# Patient Record
Sex: Female | Born: 1990 | Race: White | Hispanic: No | Marital: Single | Smoking: Never smoker
Health system: Southern US, Community
[De-identification: ages and names within clinical notes are randomized; demographics above are authoritative.]

## PROBLEM LIST (undated history)

## (undated) DIAGNOSIS — Z789 Other specified health status: Secondary | ICD-10-CM

## (undated) HISTORY — PX: NO PAST SURGERIES: SHX2092

---

## 2014-11-15 ENCOUNTER — Encounter (HOSPITAL_COMMUNITY): Payer: Self-pay | Admitting: Emergency Medicine

## 2014-11-15 ENCOUNTER — Observation Stay (HOSPITAL_COMMUNITY)
Admission: EM | Admit: 2014-11-15 | Discharge: 2014-11-16 | Disposition: A | Discharge status: Home / Self Care | Payer: BLUE CROSS/BLUE SHIELD | Attending: General Surgery | Admitting: General Surgery

## 2014-11-15 ENCOUNTER — Emergency Department (HOSPITAL_COMMUNITY): Payer: BLUE CROSS/BLUE SHIELD

## 2014-11-15 ENCOUNTER — Encounter (HOSPITAL_COMMUNITY)
Admission: EM | Disposition: A | Discharge status: Home / Self Care | Payer: Self-pay | Source: Home / Self Care | Attending: Emergency Medicine

## 2014-11-15 DIAGNOSIS — Z880 Allergy status to penicillin: Secondary | ICD-10-CM | POA: Diagnosis not present

## 2014-11-15 DIAGNOSIS — K353 Acute appendicitis with localized peritonitis, without perforation or gangrene: Secondary | ICD-10-CM

## 2014-11-15 DIAGNOSIS — R1031 Right lower quadrant pain: Secondary | ICD-10-CM | POA: Diagnosis present

## 2014-11-15 DIAGNOSIS — K3580 Unspecified acute appendicitis: Secondary | ICD-10-CM | POA: Diagnosis present

## 2014-11-15 HISTORY — PX: LAPAROSCOPIC APPENDECTOMY: SHX408

## 2014-11-15 HISTORY — DX: Other specified health status: Z78.9

## 2014-11-15 LAB — COMPREHENSIVE METABOLIC PANEL
ALT: 13 U/L — AB (ref 14–54)
AST: 22 U/L (ref 15–41)
Albumin: 4.5 g/dL (ref 3.5–5.0)
Alkaline Phosphatase: 58 U/L (ref 38–126)
Anion gap: 8 (ref 5–15)
BILIRUBIN TOTAL: 1 mg/dL (ref 0.3–1.2)
BUN: 7 mg/dL (ref 6–20)
CO2: 25 mmol/L (ref 22–32)
CREATININE: 0.6 mg/dL (ref 0.44–1.00)
Calcium: 9.3 mg/dL (ref 8.9–10.3)
Chloride: 105 mmol/L (ref 101–111)
GFR calc Af Amer: >60 mL/min (ref >60)
GFR calc non Af Amer: >60 mL/min (ref >60)
GLUCOSE: 84 mg/dL (ref 65–99)
Potassium: 4.2 mmol/L (ref 3.5–5.1)
Sodium: 138 mmol/L (ref 135–145)
TOTAL PROTEIN: 7.5 g/dL (ref 6.5–8.1)

## 2014-11-15 LAB — WET PREP, GENITAL
Clue Cells Wet Prep HPF POC: NONE SEEN
Trich, Wet Prep: NONE SEEN
Yeast Wet Prep HPF POC: NONE SEEN

## 2014-11-15 LAB — CBC
HCT: 40.4 % (ref 36.0–46.0)
Hemoglobin: 13.7 g/dL (ref 12.0–15.0)
MCH: 30.6 pg (ref 26.0–34.0)
MCHC: 33.9 g/dL (ref 30.0–36.0)
MCV: 90.2 fL (ref 78.0–100.0)
PLATELETS: 232 10*3/uL (ref 150–400)
RBC: 4.48 MIL/uL (ref 3.87–5.11)
RDW: 12.6 % (ref 11.5–15.5)
WBC: 13.5 10*3/uL — AB (ref 4.0–10.5)

## 2014-11-15 LAB — URINALYSIS, ROUTINE W REFLEX MICROSCOPIC
BILIRUBIN URINE: NEGATIVE
Glucose, UA: NEGATIVE mg/dL
HGB URINE DIPSTICK: NEGATIVE
KETONES UR: 15 mg/dL — AB
NITRITE: NEGATIVE
PH: 6.5 (ref 5.0–8.0)
Protein, ur: NEGATIVE mg/dL
Specific Gravity, Urine: 1.003 — ABNORMAL LOW (ref 1.005–1.030)
UROBILINOGEN UA: 0.2 mg/dL (ref 0.0–1.0)

## 2014-11-15 LAB — URINE MICROSCOPIC-ADD ON

## 2014-11-15 LAB — I-STAT BETA HCG BLOOD, ED (MC, WL, AP ONLY): I-stat hCG, quantitative: <5 m[IU]/mL (ref <5)

## 2014-11-15 LAB — LIPASE, BLOOD: Lipase: 20 U/L — ABNORMAL LOW (ref 22–51)

## 2014-11-15 SURGERY — APPENDECTOMY, LAPAROSCOPIC
Anesthesia: General

## 2014-11-15 MED ORDER — ONDANSETRON HCL 4 MG/2ML IJ SOLN
4.0000 mg | Freq: Once | INTRAMUSCULAR | Status: AC
  Administered 2014-11-15: 4 mg via INTRAVENOUS
  Filled 2014-11-15: qty 2

## 2014-11-15 MED ORDER — METRONIDAZOLE IN NACL 5-0.79 MG/ML-% IV SOLN
500.0000 mg | Freq: Once | INTRAVENOUS | Status: AC
  Administered 2014-11-15: 500 mg via INTRAVENOUS

## 2014-11-15 MED ORDER — MORPHINE SULFATE (PF) 4 MG/ML IV SOLN
4.0000 mg | Freq: Once | INTRAVENOUS | Status: AC
  Administered 2014-11-15: 4 mg via INTRAVENOUS
  Filled 2014-11-15: qty 1

## 2014-11-15 MED ORDER — IOHEXOL 300 MG/ML  SOLN
100.0000 mL | Freq: Once | INTRAMUSCULAR | Status: AC | PRN
  Administered 2014-11-15: 100 mL via INTRAVENOUS

## 2014-11-15 MED ORDER — ROCURONIUM BROMIDE 100 MG/10ML IV SOLN
INTRAVENOUS | Status: AC
  Filled 2014-11-15: qty 1

## 2014-11-15 MED ORDER — DEXAMETHASONE SODIUM PHOSPHATE 10 MG/ML IJ SOLN
INTRAMUSCULAR | Status: AC
  Filled 2014-11-15: qty 1

## 2014-11-15 MED ORDER — CIPROFLOXACIN IN D5W 400 MG/200ML IV SOLN
400.0000 mg | Freq: Once | INTRAVENOUS | Status: AC
  Administered 2014-11-16: 400 mg via INTRAVENOUS

## 2014-11-15 MED ORDER — LIDOCAINE HCL (CARDIAC) 20 MG/ML IV SOLN
INTRAVENOUS | Status: AC
  Filled 2014-11-15: qty 5

## 2014-11-15 MED ORDER — IOHEXOL 300 MG/ML  SOLN
25.0000 mL | Freq: Once | INTRAMUSCULAR | Status: AC | PRN
  Administered 2014-11-15: 25 mL via ORAL

## 2014-11-15 MED ORDER — MIDAZOLAM HCL 2 MG/2ML IJ SOLN
INTRAMUSCULAR | Status: AC
Start: 2014-11-15 — End: 2014-11-15
  Filled 2014-11-15: qty 4

## 2014-11-15 MED ORDER — PROPOFOL 10 MG/ML IV BOLUS
INTRAVENOUS | Status: AC
  Filled 2014-11-15: qty 20

## 2014-11-15 MED ORDER — FENTANYL CITRATE (PF) 250 MCG/5ML IJ SOLN
INTRAMUSCULAR | Status: AC
  Filled 2014-11-15: qty 25

## 2014-11-15 MED ORDER — INFLUENZA VAC SPLIT QUAD 0.5 ML IM SUSY
0.5000 mL | PREFILLED_SYRINGE | INTRAMUSCULAR | Status: DC
  Filled 2014-11-15: qty 0.5

## 2014-11-15 MED ORDER — ONDANSETRON HCL 4 MG/2ML IJ SOLN
INTRAMUSCULAR | Status: AC
  Filled 2014-11-15: qty 2

## 2014-11-15 MED ORDER — SODIUM CHLORIDE 0.9 % IV BOLUS (SEPSIS)
1000.0000 mL | Freq: Once | INTRAVENOUS | Status: AC
  Administered 2014-11-15: 1000 mL via INTRAVENOUS

## 2014-11-15 MED ORDER — METRONIDAZOLE IN NACL 5-0.79 MG/ML-% IV SOLN
INTRAVENOUS | Status: AC
  Filled 2014-11-15: qty 100

## 2014-11-15 SURGICAL SUPPLY — 34 items
APPLIER CLIP 5 13 M/L LIGAMAX5 (MISCELLANEOUS)
APPLIER CLIP ROT 10 11.4 M/L (STAPLE)
CABLE HIGH FREQUENCY MONO STRZ (ELECTRODE) ×3 IMPLANT
CHLORAPREP W/TINT 26ML (MISCELLANEOUS) ×3 IMPLANT
CLIP APPLIE 5 13 M/L LIGAMAX5 (MISCELLANEOUS) IMPLANT
CLIP APPLIE ROT 10 11.4 M/L (STAPLE) IMPLANT
COVER SURGICAL LIGHT HANDLE (MISCELLANEOUS) ×3 IMPLANT
DECANTER SPIKE VIAL GLASS SM (MISCELLANEOUS) ×3 IMPLANT
DRAPE LAPAROSCOPIC ABDOMINAL (DRAPES) ×3 IMPLANT
ELECT REM PT RETURN 9FT ADLT (ELECTROSURGICAL) ×3
ELECTRODE REM PT RTRN 9FT ADLT (ELECTROSURGICAL) ×1 IMPLANT
GLOVE BIO SURGEON STRL SZ 6.5 (GLOVE) ×2 IMPLANT
GLOVE BIO SURGEONS STRL SZ 6.5 (GLOVE) ×1
GLOVE BIOGEL PI IND STRL 7.0 (GLOVE) ×1 IMPLANT
GLOVE BIOGEL PI INDICATOR 7.0 (GLOVE) ×2
GOWN STRL REUS W/TWL 2XL LVL3 (GOWN DISPOSABLE) ×3 IMPLANT
GOWN STRL REUS W/TWL XL LVL3 (GOWN DISPOSABLE) ×3 IMPLANT
HANDLE STAPLE EGIA 4 XL (STAPLE) IMPLANT
KIT BASIN OR (CUSTOM PROCEDURE TRAY) ×3 IMPLANT
LIQUID BAND (GAUZE/BANDAGES/DRESSINGS) ×3 IMPLANT
PEN SKIN MARKING BROAD (MISCELLANEOUS) ×3 IMPLANT
POUCH SPECIMEN RETRIEVAL 10MM (ENDOMECHANICALS) ×3 IMPLANT
RELOAD EGIA 45 MED/THCK PURPLE (STAPLE) ×3 IMPLANT
RELOAD EGIA 45 TAN VASC (STAPLE) ×3 IMPLANT
SCISSORS LAP 5X35 DISP (ENDOMECHANICALS) ×3 IMPLANT
SET IRRIG TUBING LAPAROSCOPIC (IRRIGATION / IRRIGATOR) ×3 IMPLANT
SLEEVE XCEL OPT CAN 5 100 (ENDOMECHANICALS) IMPLANT
SUT VIC AB 4-0 PS2 27 (SUTURE) ×3 IMPLANT
TOWEL OR 17X26 10 PK STRL BLUE (TOWEL DISPOSABLE) ×3 IMPLANT
TRAY FOLEY W/METER SILVER 14FR (SET/KITS/TRAYS/PACK) ×3 IMPLANT
TRAY FOLEY W/METER SILVER 16FR (SET/KITS/TRAYS/PACK) IMPLANT
TRAY LAPAROSCOPIC (CUSTOM PROCEDURE TRAY) ×3 IMPLANT
TROCAR BLADELESS OPT 5 100 (ENDOMECHANICALS) IMPLANT
TROCAR XCEL BLUNT TIP 100MML (ENDOMECHANICALS) ×3 IMPLANT

## 2014-11-15 NOTE — ED Provider Notes (Signed)
CSN: [id number]     Arrival date & time 11/15/14  1542 History   First MD Initiated Contact with Patient 11/15/14 1640     Chief Complaint  Patient presents with  . Abdominal Pain     (Consider location/radiation/quality/duration/timing/severity/associated sxs/prior Treatment) HPI   24 year old female presents for evaluation of abdominal pain. Patient states since yesterday she has developed worse gradual onset worsening crampy abdominal pain. Pain initially started to the left abdomen and now radiates to her right lower quadrant. Pain is 6 out of 10 with resting and 7 out of 10 when she pressed on her abdomen. She endorse nausea without vomiting or diarrhea. She denies any specific treatment tried. She was initially seen at [facility] health services today for this complaint and was recommended to come to ER for further evaluation. She denies having any fever, chills, chest pain, shortness of breath, productive cough, dysuria, hematuria, vaginal bleeding, vaginal discharge, or rash. She denies any strenuous activities. She reports decrease in appetite last meal was yesterday. She is sexually active with 2 partners within the past 6 months using protection on occasion. Her last menstrual period was 2 weeks ago. She has not had a prior history of STD or abdominal surgery. She is currently on birth control pills. Patient does have a primary care provider in her hometown however she is here for school.   History reviewed. No pertinent past medical history. History reviewed. No pertinent past surgical history. No family history on file. Social History  Substance Use Topics  . Smoking status: None  . Smokeless tobacco: None  . Alcohol Use: Yes     Comment: occasional   OB History    No data available     Review of Systems  All other systems reviewed and are negative.     Allergies  Amoxicillin and Penicillins  Home Medications   Prior to Admission medications   Medication Sig Start Date  End Date Taking? Authorizing Provider  albuterol (PROVENTIL HFA;VENTOLIN HFA) 108 (90 BASE) MCG/ACT inhaler Inhale 2 puffs into the lungs every 4 (four) hours as needed for wheezing or shortness of breath.   Yes Historical Provider, MD  fexofenadine (ALLEGRA) 180 MG tablet Take 180 mg by mouth daily as needed for allergies or rhinitis.   Yes Historical Provider, MD  mometasone (NASONEX) 50 MCG/ACT nasal spray Place 2 sprays into the nose daily as needed (allergies).   Yes Historical Provider, MD  montelukast (SINGULAIR) 10 MG tablet Take 10 mg by mouth at bedtime as needed (allergy season).   Yes Historical Provider, MD  mupirocin ointment (BACTROBAN) 2 % Place 1 application into the nose 2 (two) times daily as needed (as needed for infection on nose.).   Yes Historical Provider, MD  norgestimate-ethinyl estradiol (MONO-LINYAH) 0.25-35 MG-MCG tablet Take 1 tablet by mouth daily.   Yes Historical Provider, MD   BP 121/65 mmHg  Pulse 97  Temp(Src) 98.2 F (36.8 C) (Oral)  Resp 20  Ht 5' 5.25" (1.657 m)  Wt 140 lb 3 oz (63.589 kg)  BMI 23.16 kg/m2  SpO2 99%  LMP 11/01/2014 Physical Exam  Constitutional: She appears well-developed and well-nourished. No distress.  HENT:  Head: Atraumatic.  Eyes: Conjunctivae are normal.  Neck: Neck supple.  Cardiovascular: Normal rate and regular rhythm.   Pulmonary/Chest: Effort normal and breath sounds normal.  Abdominal: Soft. Bowel sounds are normal. She exhibits no distension. There is tenderness (Right lower quadrant tenderness with mild guarding but no rebound tenderness. Positive psoas/obturator  sign.  pain at McBurney's point.  Negative Murphy sign.).  Genitourinary:  Pelvic exam: RN in room as chaperone, external female genitalia normal with no signs of lesions or injuries. Speculum exam shows normal cervix with no obvious discharge. Bimanual exam with no adnexal tenderness, no cervical motion tenderness, uterus normal size and nontender, no masses  appreciated. The external cervical os is closed.   Neurological: She is alert.  Skin: No rash noted.  Psychiatric: She has a normal mood and affect.  Nursing note and vitals reviewed.   ED Course  Procedures (including critical care time)  4:58 PM Pt here with RLQ abd pain, leukocytosis, and anorexia concerning for appy.  Work up initiated. Pelvic examination unremarkable.    9:08 PM Abdominal and pelvis CT scan demonstrated evidence of acute appendicitis with dilated and inflamed appendix. No evidence of abscess or perforation at this time although there is moderate volume of free fluid in the right lower quadrant and pelvis which favor reactive and less likely perforation.   Appreciate consultation from [facility] Dr. Maisie Fus who will see pt in ER and will admit for further management.  Pt's last meal was yesterday.  Care discussed with Dr. Ethelda Chick.  Pt is made aware of plan and agrees with plan.    Labs Review Labs Reviewed  WET PREP, GENITAL - Abnormal; Notable for the following:    WBC, Wet Prep HPF POC FEW (*)    All other components within normal limits  LIPASE, BLOOD - Abnormal; Notable for the following:    Lipase 20 (*)    All other components within normal limits  COMPREHENSIVE METABOLIC PANEL - Abnormal; Notable for the following:    ALT 13 (*)    All other components within normal limits  CBC - Abnormal; Notable for the following:    WBC 13.5 (*)    All other components within normal limits  URINALYSIS, ROUTINE W REFLEX MICROSCOPIC (NOT AT [facility]) - Abnormal; Notable for the following:    Specific Gravity, Urine 1.003 (*)    Ketones, ur 15 (*)    Leukocytes, UA TRACE (*)    All other components within normal limits  URINE MICROSCOPIC-ADD ON - Abnormal; Notable for the following:    Squamous Epithelial / LPF FEW (*)    All other components within normal limits  RPR  HIV ANTIBODY (ROUTINE TESTING)  I-STAT BETA HCG BLOOD, ED ([facility], [facility], [facility] ONLY)  GC/CHLAMYDIA PROBE AMP  ([place]) NOT AT [facility]    Imaging Review Ct Abdomen Pelvis W Contrast  11/15/2014  CLINICAL DATA:  24 year old female with lower abdominal pain since yesterday, maximal in the right lower quadrant. Nausea. Initial encounter. EXAM: CT ABDOMEN AND PELVIS WITH CONTRAST TECHNIQUE: Multidetector CT imaging of the abdomen and pelvis was performed using the standard protocol following bolus administration of intravenous contrast. CONTRAST:  25mL OMNIPAQUE IOHEXOL 300 MG/ML SOLN, OMNIPAQUE IOHEXOL 300 MG/ML SOLN COMPARISON:  None. FINDINGS: Negative lung bases.  No pericardial or pleural effusion. Lower lumbar disc bulging. No acute osseous abnormality identified. Moderate volume of pelvic free fluid. Negative uterus and adnexa. Negative rectum, gas distended. Diminutive urinary bladder. Oral contrast has reached the sigmoid colon which is redundant. Intermittent retained stool throughout the colon. In the right lower quadrant the appendix is abnormal. It is dilated and inflamed, measuring up to 18 mm in thickness. Hyper enhancement throughout the appendix. Small volume surrounding free fluid. No extraluminal gas identified. Some low-density stool suspected in the appendix. The terminal  ileum is unaffected. No dilated small bowel. Negative stomach and duodenum. No upper abdominal free fluid or free air. Liver, gallbladder, spleen, pancreas and adrenal glands are within normal limits. Portal venous system is patent. Major arterial structures are patent. Both kidneys are normal. No lymphadenopathy. IMPRESSION: Acute appendicitis with dilated and inflamed appendix. Small to moderate volume of free fluid in the right lower quadrant and pelvis, favor reactive without strong evidence of perforation at this time. Electronically Signed   By: Odessa FlemingH  Hall M.D.   On: 11/15/2014 20:52   I have personally reviewed and evaluated these images and lab results as part of my medical decision-making.   EKG  Interpretation None      MDM   Final diagnoses:  Acute appendicitis with localized peritonitis    BP 112/57 mmHg  Pulse 81  Temp(Src) 98.6 F (37 C) (Oral)  Resp 16  Ht 5' 5.25" (1.657 m)  Wt 140 lb 3 oz (63.589 kg)  BMI 23.16 kg/m2  SpO2 100%  LMP 11/01/2014  The patient's emergency department course was discussed with the admitting team. They have agreed to evaluate and admit the patient to their service for continued medical care.  This was discussed with the patient and they are in agreement with this plan. Further orders and medical decision making per admitting team.     Fayrene HelperBowie Vamsi Apfel, PA-C 11/15/14 2111  Doug SouSam Jacubowitz, MD 11/16/14 [id number]

## 2014-11-15 NOTE — ED Provider Notes (Signed)
Complains of lower abdominal pain onset yesterday morning which has since migrated to right lower quadrant. Pain is under control since treatment here. She denies other symptoms. On exam patient alert no distress abdomen nondistended. Tender right lower quadrant. No guarding rigidity or rebound  Doug SouSam Briceida Rasberry, MD 11/15/14 1953

## 2014-11-15 NOTE — Progress Notes (Signed)
Patient listed as not having a pcp or insurance.  EDCM spoke to patient at bedside.  Patient reports he has [employer] insurance without a pcp.  Patient is from [place] going to school in [place] at [employer].  Patient is aware there is a physician on campus.  EDCM instructed patient that she may call the phone number on the back of her insurance card or go to [employer] web site to help her find a pcp who is close to her and within network.  Patient thankful for information.  No further EDCM needs at this time.

## 2014-11-15 NOTE — ED Notes (Signed)
Patient transported to CT 

## 2014-11-15 NOTE — H&P (Signed)
Charish Schroepfer is an 24 y.o. female.   Chief Complaint: rlq pain HPI:        Complains of lower abdominal pain that started yesterday morning and has since migrated to right lower quadrant. She denies other symptoms except mild nausea.  She has never had this pain before.  Denies dysuria or hematuria.  She has never had any abdominal surgeries before.        History reviewed. No pertinent past medical history.  History reviewed. No pertinent past surgical history.  No family history on file. Social History:  reports that she drinks alcohol. Her tobacco and drug histories are not on file.  Allergies:  Allergies  Allergen Reactions  . Amoxicillin Hives    Has patient had a PCN reaction causing immediate rash, facial/tongue/throat swelling, SOB or lightheadedness with hypotension: No Has patient had a PCN reaction causing severe rash involving mucus membranes or skin necrosis: yes-hives  Has patient had a PCN reaction that required hospitalization No Has patient had a PCN reaction occurring within the last 10 years: Yes If all of the above answers are "NO", then may proceed with Cephalosporin use.   Marland Kitchen Penicillins Hives    Has patient had a PCN reaction causing immediate rash, facial/tongue/throat swelling, SOB or lightheadedness with hypotension: No Has patient had a PCN reaction causing severe rash involving mucus membranes or skin necrosis: Yes-hives  Has patient had a PCN reaction that required hospitalization No Has patient had a PCN reaction occurring within the last 10 years: Yes  If all of the above answers are "NO", then may proceed with Cephalosporin use.      (Not in a hospital admission)  Results for orders placed or performed during the hospital encounter of 11/15/14 (from the past 48 hour(s))  Urinalysis, Routine w reflex microscopic (not at [facility])     Status: Abnormal   Collection Time: 11/15/14  4:05 PM  Result Value Ref Range   Color, Urine YELLOW YELLOW   APPearance CLEAR CLEAR   Specific Gravity, Urine 1.003 (L) 1.005 - 1.030   pH 6.5 5.0 - 8.0   Glucose, UA NEGATIVE NEGATIVE mg/dL   Hgb urine dipstick NEGATIVE NEGATIVE   Bilirubin Urine NEGATIVE NEGATIVE   Ketones, ur 15 (A) NEGATIVE mg/dL   Protein, ur NEGATIVE NEGATIVE mg/dL   Urobilinogen, UA 0.2 0.0 - 1.0 mg/dL   Nitrite NEGATIVE NEGATIVE   Leukocytes, UA TRACE (A) NEGATIVE  Urine microscopic-add on     Status: Abnormal   Collection Time: 11/15/14  4:05 PM  Result Value Ref Range   Squamous Epithelial / LPF FEW (A) RARE   WBC, UA 0-2 <3 WBC/hpf   Bacteria, UA RARE RARE  Lipase, blood     Status: Abnormal   Collection Time: 11/15/14  4:25 PM  Result Value Ref Range   Lipase 20 (L) 22 - 51 U/L  Comprehensive metabolic panel     Status: Abnormal   Collection Time: 11/15/14  4:25 PM  Result Value Ref Range   Sodium 138 135 - 145 mmol/L   Potassium 4.2 3.5 - 5.1 mmol/L   Chloride 105 101 - 111 mmol/L   CO2 25 22 - 32 mmol/L   Glucose, Bld 84 65 - 99 mg/dL   BUN 7 6 - 20 mg/dL   Creatinine, Ser 0.60 0.44 - 1.00 mg/dL   Calcium 9.3 8.9 - 10.3 mg/dL   Total Protein 7.5 6.5 - 8.1 g/dL   Albumin 4.5 3.5 - 5.0 g/dL  AST 22 15 - 41 U/L   ALT 13 (L) 14 - 54 U/L   Alkaline Phosphatase 58 38 - 126 U/L   Total Bilirubin 1.0 0.3 - 1.2 mg/dL   GFR calc non Af Amer >60 >60 mL/min   GFR calc Af Amer >60 >60 mL/min    Comment: (NOTE) The eGFR has been calculated using the CKD EPI equation. This calculation has not been validated in all clinical situations. eGFR's persistently <60 mL/min signify possible Chronic Kidney Disease.    Anion gap 8 5 - 15  CBC     Status: Abnormal   Collection Time: 11/15/14  4:25 PM  Result Value Ref Range   WBC 13.5 (H) 4.0 - 10.5 K/uL   RBC 4.48 3.87 - 5.11 MIL/uL   Hemoglobin 13.7 12.0 - 15.0 g/dL   HCT 40.4 36.0 - 46.0 %   MCV 90.2 78.0 - 100.0 fL   MCH 30.6 26.0 - 34.0 pg   MCHC 33.9 30.0 - 36.0 g/dL   RDW 12.6 11.5 - 15.5 %   Platelets 232  150 - 400 K/uL  I-Stat beta hCG blood, ED ([facility], [facility], [facility] only)     Status: None   Collection Time: 11/15/14  4:37 PM  Result Value Ref Range   I-stat hCG, quantitative <5.0 <5 mIU/mL   Comment 3            Comment:   GEST. AGE      CONC.  (mIU/mL)   <=1 WEEK        5 - 50     2 WEEKS       50 - 500     3 WEEKS       100 - 10,000     4 WEEKS     1,000 - 30,000        FEMALE AND NON-PREGNANT FEMALE:     LESS THAN 5 mIU/mL   Wet prep, genital     Status: Abnormal   Collection Time: 11/15/14  5:09 PM  Result Value Ref Range   Yeast Wet Prep HPF POC NONE SEEN NONE SEEN   Trich, Wet Prep NONE SEEN NONE SEEN   Clue Cells Wet Prep HPF POC NONE SEEN NONE SEEN   WBC, Wet Prep HPF POC FEW (A) NONE SEEN   Ct Abdomen Pelvis W Contrast  11/15/2014  CLINICAL DATA:  24 year old female with lower abdominal pain since yesterday, maximal in the right lower quadrant. Nausea. Initial encounter. EXAM: CT ABDOMEN AND PELVIS WITH CONTRAST TECHNIQUE: Multidetector CT imaging of the abdomen and pelvis was performed using the standard protocol following bolus administration of intravenous contrast. CONTRAST:  62m OMNIPAQUE IOHEXOL 300 MG/ML SOLN, 1064mOMNIPAQUE IOHEXOL 300 MG/ML SOLN COMPARISON:  None. FINDINGS: Negative lung bases.  No pericardial or pleural effusion. Lower lumbar disc bulging. No acute osseous abnormality identified. Moderate volume of pelvic free fluid. Negative uterus and adnexa. Negative rectum, gas distended. Diminutive urinary bladder. Oral contrast has reached the sigmoid colon which is redundant. Intermittent retained stool throughout the colon. In the right lower quadrant the appendix is abnormal. It is dilated and inflamed, measuring up to 18 mm in thickness. Hyper enhancement throughout the appendix. Small volume surrounding free fluid. No extraluminal gas identified. Some low-density stool suspected in the appendix. The terminal ileum is unaffected. No dilated small bowel. Negative stomach  and duodenum. No upper abdominal free fluid or free air. Liver, gallbladder, spleen, pancreas and adrenal glands are within normal limits.  Portal venous system is patent. Major arterial structures are patent. Both kidneys are normal. No lymphadenopathy. IMPRESSION: Acute appendicitis with dilated and inflamed appendix. Small to moderate volume of free fluid in the right lower quadrant and pelvis, favor reactive without strong evidence of perforation at this time. Electronically Signed   By: Genevie Ann M.D.   On: 11/15/2014 20:52    Review of Systems  Constitutional: Negative for fever and chills.  Eyes: Negative for blurred vision.  Respiratory: Negative for cough and shortness of breath.   Cardiovascular: Negative for chest pain, palpitations and orthopnea.  Gastrointestinal: Positive for nausea and abdominal pain. Negative for vomiting, diarrhea and constipation.  Genitourinary: Negative for dysuria, urgency and frequency.  Musculoskeletal: Negative for myalgias.  Skin: Negative for rash.  Neurological: Negative for dizziness and headaches.    Blood pressure 112/57, pulse 81, temperature 98.6 F (37 C), temperature source Oral, resp. rate 16, height 5' 5.25" (1.657 m), weight 63.589 kg (140 lb 3 oz), last menstrual period 11/01/2014, SpO2 100 %. Physical Exam  Constitutional: She is oriented to person, place, and time. She appears well-developed and well-nourished. No distress.  HENT:  Head: Normocephalic and atraumatic.  Eyes: Conjunctivae are normal. Pupils are equal, round, and reactive to light.  Neck: Normal range of motion.  Cardiovascular: Normal rate and regular rhythm.   Respiratory: Effort normal and breath sounds normal.  GI: Soft. She exhibits no distension. There is tenderness. There is no rebound and no guarding.  rlq  Musculoskeletal: Normal range of motion.  Neurological: She is alert and oriented to person, place, and time.  Skin: Skin is warm and dry. She is not  diaphoretic.     Assessment/Plan Patient with signs and symptoms consistent with appendicitis.  I have recommended appendectomy.  Risks include bleeding, pain, damage to adjacent structures, infection and leak of staple line.  I believe she understands this and has agreed to proceed with surgery.  Delio Slates C. [id number], 9:21 PM

## 2014-11-15 NOTE — ED Notes (Signed)
Pt c/o lower/mid abd pain that started yesterday. Pt states that today pain got worse on the RLQ.  Pt states some nausea but denies v/d.  [facility] and referred here for further treatment.

## 2014-11-15 NOTE — ED Notes (Signed)
[facility].  Pt c/o RLQ abdominal pain x 1 day.  WBC 14.8.

## 2014-11-15 NOTE — ED Notes (Signed)
PA and NT at bedside for pelvic exam 

## 2014-11-15 NOTE — Anesthesia Preprocedure Evaluation (Signed)
Anesthesia Evaluation  Patient identified by MRN, date of birth, ID band Patient awake    Reviewed: Allergy & Precautions, H&P , NPO status , Patient's Chart, lab work & pertinent test results  Airway Mallampati: II  TM Distance: >3 FB Neck ROM: full    Dental no notable dental hx. (+) Teeth Intact, Dental Advisory Given   Pulmonary neg pulmonary ROS,    Pulmonary exam normal breath sounds clear to auscultation       Cardiovascular Exercise Tolerance: Good negative cardio ROS Normal cardiovascular exam Rhythm:regular Rate:Normal     Neuro/Psych negative neurological ROS  negative psych ROS   GI/Hepatic negative GI ROS, Neg liver ROS,   Endo/Other  negative endocrine ROS  Renal/GU negative Renal ROS  negative genitourinary   Musculoskeletal   Abdominal   Peds  Hematology negative hematology ROS (+)   Anesthesia Other Findings   Reproductive/Obstetrics negative OB ROS                             Anesthesia Physical Anesthesia Plan  ASA: II  Anesthesia Plan: General   Post-op Pain Management:    Induction: Intravenous, Rapid sequence and Cricoid pressure planned  Airway Management Planned: Oral ETT  Additional Equipment:   Intra-op Plan:   Post-operative Plan: Extubation in OR  Informed Consent: I have reviewed the patients History and Physical, chart, labs and discussed the procedure including the risks, benefits and alternatives for the proposed anesthesia with the patient or authorized representative who has indicated his/her understanding and acceptance.   Dental Advisory Given  Plan Discussed with: CRNA and Surgeon  Anesthesia Plan Comments:         Anesthesia Quick Evaluation

## 2014-11-16 ENCOUNTER — Encounter (HOSPITAL_COMMUNITY): Payer: Self-pay | Admitting: General Surgery

## 2014-11-16 ENCOUNTER — Emergency Department (HOSPITAL_COMMUNITY): Payer: BLUE CROSS/BLUE SHIELD | Admitting: Registered Nurse

## 2014-11-16 DIAGNOSIS — K3580 Unspecified acute appendicitis: Secondary | ICD-10-CM | POA: Diagnosis present

## 2014-11-16 LAB — GC/CHLAMYDIA PROBE AMP (~~LOC~~) NOT AT ARMC
Chlamydia: NEGATIVE
NEISSERIA GONORRHEA: NEGATIVE

## 2014-11-16 LAB — RPR: RPR Ser Ql: NONREACTIVE

## 2014-11-16 LAB — HIV ANTIBODY (ROUTINE TESTING W REFLEX): HIV SCREEN 4TH GENERATION: NONREACTIVE

## 2014-11-16 MED ORDER — SUCCINYLCHOLINE CHLORIDE 20 MG/ML IJ SOLN
INTRAMUSCULAR | Status: DC | PRN
  Administered 2014-11-16: 100 mg via INTRAVENOUS

## 2014-11-16 MED ORDER — SCOPOLAMINE 1 MG/3DAYS TD PT72
MEDICATED_PATCH | TRANSDERMAL | Status: DC | PRN
  Administered 2014-11-16: 1 via TRANSDERMAL

## 2014-11-16 MED ORDER — ONDANSETRON HCL 4 MG/2ML IJ SOLN: 4.0000 mg | Freq: Four times a day (QID) | INTRAMUSCULAR | Status: DC | PRN

## 2014-11-16 MED ORDER — GLYCOPYRROLATE 0.2 MG/ML IJ SOLN
INTRAMUSCULAR | Status: DC | PRN
  Administered 2014-11-16: 0.4 mg via INTRAVENOUS

## 2014-11-16 MED ORDER — ARTIFICIAL TEARS OP OINT
TOPICAL_OINTMENT | OPHTHALMIC | Status: AC
  Filled 2014-11-16: qty 3.5

## 2014-11-16 MED ORDER — 0.9 % SODIUM CHLORIDE (POUR BTL) OPTIME
TOPICAL | Status: DC | PRN
  Administered 2014-11-16: 1000 mL

## 2014-11-16 MED ORDER — ONDANSETRON HCL 4 MG/2ML IJ SOLN
INTRAMUSCULAR | Status: DC | PRN
  Administered 2014-11-16: 4 mg via INTRAVENOUS

## 2014-11-16 MED ORDER — SENNOSIDES-DOCUSATE SODIUM 8.6-50 MG PO TABS: 1.0000 | ORAL_TABLET | Freq: Every day | ORAL | Status: DC

## 2014-11-16 MED ORDER — HYDROCODONE-ACETAMINOPHEN 5-325 MG PO TABS: 1.0000 | ORAL_TABLET | ORAL | Status: AC | PRN

## 2014-11-16 MED ORDER — CIPROFLOXACIN IN D5W 400 MG/200ML IV SOLN
INTRAVENOUS | Status: AC
  Filled 2014-11-16: qty 200

## 2014-11-16 MED ORDER — ROCURONIUM BROMIDE 100 MG/10ML IV SOLN
INTRAVENOUS | Status: DC | PRN
  Administered 2014-11-16: 25 mg via INTRAVENOUS

## 2014-11-16 MED ORDER — NEOSTIGMINE METHYLSULFATE 10 MG/10ML IV SOLN
INTRAVENOUS | Status: AC
  Filled 2014-11-16: qty 1

## 2014-11-16 MED ORDER — KCL IN DEXTROSE-NACL 20-5-0.45 MEQ/L-%-% IV SOLN
INTRAVENOUS | Status: DC
  Administered 2014-11-16: 03:00:00 via INTRAVENOUS
  Filled 2014-11-16: qty 1000

## 2014-11-16 MED ORDER — SCOPOLAMINE 1 MG/3DAYS TD PT72
MEDICATED_PATCH | TRANSDERMAL | Status: AC
  Filled 2014-11-16: qty 1

## 2014-11-16 MED ORDER — HYDROMORPHONE HCL 1 MG/ML IJ SOLN: 0.2500 mg | INTRAMUSCULAR | Status: DC | PRN

## 2014-11-16 MED ORDER — ONDANSETRON 4 MG PO TBDP: 4.0000 mg | ORAL_TABLET | Freq: Four times a day (QID) | ORAL | Status: DC | PRN

## 2014-11-16 MED ORDER — MIDAZOLAM HCL 5 MG/5ML IJ SOLN
INTRAMUSCULAR | Status: DC | PRN
  Administered 2014-11-16: 2 mg via INTRAVENOUS

## 2014-11-16 MED ORDER — NEOSTIGMINE METHYLSULFATE 10 MG/10ML IV SOLN
INTRAVENOUS | Status: DC | PRN
  Administered 2014-11-16: 3 mg via INTRAVENOUS

## 2014-11-16 MED ORDER — PROPOFOL 10 MG/ML IV BOLUS
INTRAVENOUS | Status: DC | PRN
  Administered 2014-11-16: 200 mg via INTRAVENOUS

## 2014-11-16 MED ORDER — NORGESTIMATE-ETH ESTRADIOL 0.25-35 MG-MCG PO TABS
1.0000 | ORAL_TABLET | Freq: Every day | ORAL | Status: DC
  Administered 2014-11-16: 1 via ORAL

## 2014-11-16 MED ORDER — GLYCOPYRROLATE 0.2 MG/ML IJ SOLN
INTRAMUSCULAR | Status: AC
  Filled 2014-11-16: qty 2

## 2014-11-16 MED ORDER — BUPIVACAINE-EPINEPHRINE 0.5% -1:200000 IJ SOLN
INTRAMUSCULAR | Status: DC | PRN
  Administered 2014-11-16: 15 mL

## 2014-11-16 MED ORDER — FENTANYL CITRATE (PF) 100 MCG/2ML IJ SOLN
INTRAMUSCULAR | Status: DC | PRN
  Administered 2014-11-16: 100 ug via INTRAVENOUS
  Administered 2014-11-16: 50 ug via INTRAVENOUS
  Administered 2014-11-16: 100 ug via INTRAVENOUS

## 2014-11-16 MED ORDER — LACTATED RINGERS IV SOLN
INTRAVENOUS | Status: DC | PRN
  Administered 2014-11-16: via INTRAVENOUS

## 2014-11-16 MED ORDER — LIDOCAINE HCL (CARDIAC) 20 MG/ML IV SOLN
INTRAVENOUS | Status: DC | PRN
  Administered 2014-11-16: 25 mg via INTRATRACHEAL
  Administered 2014-11-16: 100 mg via INTRAVENOUS

## 2014-11-16 MED ORDER — BUPIVACAINE-EPINEPHRINE (PF) 0.5% -1:200000 IJ SOLN
INTRAMUSCULAR | Status: AC
  Filled 2014-11-16: qty 30

## 2014-11-16 MED ORDER — MORPHINE SULFATE (PF) 2 MG/ML IV SOLN: 2.0000 mg | INTRAVENOUS | Status: DC | PRN

## 2014-11-16 MED ORDER — LACTATED RINGERS IV SOLN: INTRAVENOUS | Status: DC

## 2014-11-16 MED ORDER — HYDROCODONE-ACETAMINOPHEN 5-325 MG PO TABS: 1.0000 | ORAL_TABLET | ORAL | Status: DC | PRN

## 2014-11-16 MED ORDER — SIMETHICONE 80 MG PO CHEW
40.0000 mg | CHEWABLE_TABLET | Freq: Four times a day (QID) | ORAL | Status: DC | PRN
  Filled 2014-11-16: qty 1

## 2014-11-16 MED ORDER — BISACODYL 10 MG RE SUPP: 10.0000 mg | Freq: Every day | RECTAL | Status: DC | PRN

## 2014-11-16 MED ORDER — OXYCODONE-ACETAMINOPHEN 5-325 MG PO TABS: 1.0000 | ORAL_TABLET | ORAL | Status: DC | PRN

## 2014-11-16 MED ORDER — DEXAMETHASONE SODIUM PHOSPHATE 10 MG/ML IJ SOLN
INTRAMUSCULAR | Status: DC | PRN
  Administered 2014-11-16: 10 mg via INTRAVENOUS

## 2014-11-16 MED ORDER — IBUPROFEN 200 MG PO TABS
600.0000 mg | ORAL_TABLET | Freq: Four times a day (QID) | ORAL | Status: DC | PRN
  Administered 2014-11-16: 600 mg via ORAL
  Filled 2014-11-16: qty 3

## 2014-11-16 MED ORDER — IBUPROFEN 200 MG PO TABS: ORAL_TABLET | ORAL | Status: AC

## 2014-11-16 MED ORDER — LACTATED RINGERS IR SOLN
Status: DC | PRN
  Administered 2014-11-16: 1

## 2014-11-16 NOTE — Discharge Instructions (Signed)
Laparoscopic Appendectomy, Adult, Care After Refer to this sheet in the next few weeks. These instructions provide you with information on caring for yourself after your procedure. Your caregiver may also give you more specific instructions. Your treatment has been planned according to current medical practices, but problems sometimes occur. Call your caregiver if you have any problems or questions after your procedure. HOME CARE INSTRUCTIONS  Do not drive while taking narcotic pain medicines.  Use stool softener if you become constipated from your pain medicines.  Change your bandages (dressings) as directed.  Keep your wounds clean and dry. You may wash the wounds gently with soap and water. Gently pat the wounds dry with a clean towel.  Do not take baths, swim, or use hot tubs for 10 days, or as instructed by your caregiver.  Only take over-the-counter or prescription medicines for pain, discomfort, or fever as directed by your caregiver.  You may continue your normal diet as directed.  Do not lift more than 10 pounds (4.5 kg) or play contact sports for 3 weeks, or as directed.  Slowly increase your activity after surgery.  Take deep breaths to avoid getting a lung infection (pneumonia). SEEK MEDICAL CARE IF:  You have redness, swelling, or increasing pain in your wounds.  You have pus coming from your wounds.  You have drainage from a wound that lasts longer than 1 day.  You notice a bad smell coming from the wounds or dressing.  Your wound edges break open after stitches (sutures) have been removed.  You notice increasing pain in the shoulders (shoulder strap areas) or near your shoulder blades.  You develop dizzy episodes or fainting while standing.  You develop shortness of breath.  You develop persistent nausea or vomiting.  You cannot control your bowel functions or lose your appetite.  You develop diarrhea. SEEK IMMEDIATE MEDICAL CARE IF:   You have a  fever.  You develop a rash.  You have difficulty breathing or sharp pains in your chest.  You develop any reaction or side effects to medicines given. MAKE SURE YOU:  Understand these instructions.  Will watch your condition.  Will get help right away if you are not doing well or get worse.   This information is not intended to replace advice given to you by your health care provider. Make sure you discuss any questions you have with your health care provider.   Document Released: 01/20/2005 Document Revised: 06/06/2014 Document Reviewed: 07/10/2014 Elsevier Interactive Patient Education 2016 [employer] Inc[employer]  Laparoscopic Appendectomy, Adult, Care After Refer to this sheet in the next few weeks. These instructions provide you with information on caring for yourself after your procedure. Your caregiver may also give you more specific instructions. Your treatment has been planned according to current medical practices, but problems sometimes occur. Call your caregiver if you have any problems or questions after your procedure. HOME CARE INSTRUCTIONS  Do not drive while taking narcotic pain medicines.  Use stool softener if you become constipated from your pain medicines.  Change your bandages (dressings) as directed.  Keep your wounds clean and dry. You may wash the wounds gently with soap and water. Gently pat the wounds dry with a clean towel.  Do not take baths, swim, or use hot tubs for 10 days, or as instructed by your caregiver.  Only take over-the-counter or prescription medicines for pain, discomfort, or fever as directed by your caregiver.  You may continue your normal diet as directed.  Do not  lift more than 10 pounds (4.5 kg) or play contact sports for 3 weeks, or as directed.  Slowly increase your activity after surgery.  Take deep breaths to avoid getting a lung infection (pneumonia). SEEK MEDICAL CARE IF:  You have redness, swelling, or increasing pain in your  wounds.  You have pus coming from your wounds.  You have drainage from a wound that lasts longer than 1 day.  You notice a bad smell coming from the wounds or dressing.  Your wound edges break open after stitches (sutures) have been removed.  You notice increasing pain in the shoulders (shoulder strap areas) or near your shoulder blades.  You develop dizzy episodes or fainting while standing.  You develop shortness of breath.  You develop persistent nausea or vomiting.  You cannot control your bowel functions or lose your appetite.  You develop diarrhea. SEEK IMMEDIATE MEDICAL CARE IF:   You have a fever.  You develop a rash.  You have difficulty breathing or sharp pains in your chest.  You develop any reaction or side effects to medicines given. MAKE SURE YOU:  Understand these instructions.  Will watch your condition.  Will get help right away if you are not doing well or get worse.   This information is not intended to replace advice given to you by your health care provider. Make sure you discuss any questions you have with your health care provider.   Document Released: 01/20/2005 Document Revised: 06/06/2014 Document Reviewed: 07/10/2014 Elsevier Interactive Patient Education 2016 [employer]   [facility] _____[facility] LAPAROSCOPIC SURGERY: POST OP INSTRUCTIONS Always review your discharge instruction sheet given to you by the facility where your surgery was performed. IF YOU HAVE DISABILITY OR FAMILY LEAVE FORMS, YOU MUST BRING THEM TO THE OFFICE FOR PROCESSING.   DO NOT GIVE THEM TO YOUR DOCTOR.  1. A prescription for pain medication may be given to you upon discharge.  Take your pain medication as prescribed, if needed.  If narcotic pain medicine is not needed, then you may take acetaminophen (Tylenol) or ibuprofen (Advil) as needed. 2. Take your usually prescribed medications unless otherwise directed. 3. If you need a refill on your  pain medication, please contact your pharmacy.  They will contact our office to request authorization. Prescriptions will not be filled after 5pm or on week-ends. 4. You should follow a light diet the first few days after arrival home, such as soup and crackers, etc.  Be sure to include lots of fluids daily. 5. Most patients will experience some swelling and bruising in the area of the incisions.  Ice packs will help.  Swelling and bruising can take several days to resolve.  6. It is common to experience some constipation if taking pain medication after surgery.  Increasing fluid intake and taking a stool softener (such as Colace) will usually help or prevent this problem from occurring.  A mild laxative (Milk of Magnesia or Miralax) should be taken according to package instructions if there are no bowel movements after 48 hours. 7. Unless discharge instructions indicate otherwise, you may remove your bandages 24-48 hours after surgery, and you may shower at that time.  You may have steri-strips (small skin tapes) in place directly over the incision.  These strips should be left on the skin for 7-10 days.  If your surgeon used skin glue on the incision, you may shower in 24 hours.  The glue will flake off over the next 2-3 weeks.  Any  sutures or staples will be removed at the office during your follow-up visit. 8. ACTIVITIES:  You may resume regular (light) daily activities beginning the next day--such as daily self-care, walking, climbing stairs--gradually increasing activities as tolerated.  You may have sexual intercourse when it is comfortable.  Refrain from any heavy lifting or straining until approved by your doctor. a. You may drive when you are no longer taking prescription pain medication, you can comfortably wear a seatbelt, and you can safely maneuver your car and apply brakes. b. RETURN TO WORK:  __________________________________________________________ 9. You should see your doctor in the  office for a follow-up appointment approximately 2-3 weeks after your surgery.  Make sure that you call for this appointment within a day or two after you arrive home to insure a convenient appointment time. 10. OTHER INSTRUCTIONS: __________________________________________________________________________________________________________________________ __________________________________________________________________________________________________________________________ WHEN TO CALL YOUR DOCTOR: 1. Fever over 101.0 2. Inability to urinate 3. Continued bleeding from incision. 4. Increased pain, redness, or drainage from the incision. 5. Increasing abdominal pain  The clinic staff is available to answer your questions during regular business hours.  Please dont hesitate to call and ask to speak to one of the nurses for clinical concerns.  If you have a medical emergency, go to the nearest emergency room or call 911.  A surgeon from [facility] is always on call at the hospital. [address], [address], [place], [place]  [address] ? [address] [address], [place], [place]   [address] [phone] ? [phone] ? FAX [phone] Web site: [email or web address]

## 2014-11-16 NOTE — Anesthesia Procedure Notes (Signed)
Procedure Name: Intubation Date/Time: 11/16/2014 12:24 AM Performed by: Illene SilverEVANS, Santos Sollenberger E Pre-anesthesia Checklist: Patient identified, Emergency Drugs available, Suction available and Patient being monitored Patient Re-evaluated:Patient Re-evaluated prior to inductionOxygen Delivery Method: Circle System Utilized Preoxygenation: Pre-oxygenation with 100% oxygen Intubation Type: IV induction Ventilation: Mask ventilation without difficulty Tube type: Oral Tube size: 7.5 mm Number of attempts: 1 Airway Equipment and Method: Stylet and Oral airway Placement Confirmation: ETT inserted through vocal cords under direct vision,  positive ETCO2 and breath sounds checked- equal and bilateral Secured at: 21 cm Tube secured with: Tape Dental Injury: Teeth and Oropharynx as per pre-operative assessment

## 2014-11-16 NOTE — Op Note (Signed)
Desiree Burke [id number]   PRE-OPERATIVE DIAGNOSIS:  Acute appendicitis  POST-OPERATIVE DIAGNOSIS:  Acute appendicitis   Procedure(s): APPENDECTOMY LAPAROSCOPIC   Surgeon(s): Romie Levee, MD  ASSISTANT: none   ANESTHESIA:   local and general  EBL:   min  Delay start of Pharmacological VTE agent (>24hrs) due to surgical blood loss or risk of bleeding:  no  DRAINS: none   SPECIMEN:  Source of Specimen:  appendix  DISPOSITION OF SPECIMEN:  PATHOLOGY  COUNTS:  YES  PLAN OF CARE: Admit for overnight observation  PATIENT DISPOSITION:  PACU - hemodynamically stable.   INDICATIONS: Patient with concerning symptoms & work up suspicious for appendicitis.  Surgery was recommended:  The anatomy & physiology of the digestive tract was discussed.  The pathophysiology of appendicitis was discussed.  Natural history risks without surgery was discussed.   I feel the risks of no intervention will lead to serious problems that outweigh the operative risks; therefore, I recommended diagnostic laparoscopy with removal of appendix to remove the pathology.  Laparoscopic & open techniques were discussed.   I noted a good likelihood this will help address the problem.    Risks such as bleeding, infection, abscess, leak, reoperation, possible ostomy, hernia, heart attack, death, and other risks were discussed.  Goals of post-operative recovery were discussed as well.  We will work to minimize complications.  Questions were answered.  The patient expresses understanding & wishes to proceed with surgery.  OR FINDINGS:  acute appendicitis.    DESCRIPTION:   The patient was identified & brought into the operating room. The patient was positioned supine with left arm tucked. SCDs were active during the entire case. The patient underwent general anesthesia without any difficulty.  A foley catheter was inserted under sterile conditions. The abdomen was prepped and draped in a sterile fashion. A  Surgical Timeout confirmed our plan.   I made a transverse incision through the inferior umbilical fold.  I made a nick in the infraumbilical fascia and confirmed peritoneal entry.  I placed a stay suture and then the [facility] port.  We induced carbon dioxide insufflation.  Camera inspection revealed no injury.  I placed additional ports under direct laparoscopic visualization.  I mobilized the terminal ileum to proximal ascending colon in a lateral to medial fashion.  I took care to avoid injuring any retroperitoneal structures.   I freed the appendix off its attachments to the ascending colon and cecal mesentery.  I elevated the appendix.  I was able to free off the base of the appendix, which was still viable.  I stapled the appendix off the cecum using a laparoscopic purple load Covidian stapler.  I took a healthy cuff viable cecum. I skeletonized & ligated the mesoappendix with a vascular load stapler.   I placed the appendix inside an EndoCatch bag and removed out the [place] port.  I did copious irrigation. Hemostasis was good in the mesoappendix, colon mesentery, and retroperitoneum. Staple line was intact on the cecum with no bleeding. I washed out the pelvis, retrohepatic space and right paracolic gutter.  Hemostasis is good. There was no perforation or injury.  Because the area cleaned up well after irrigation, I did not place a drain.  I aspirated the carbon dioxide. I removed the ports. I closed the umbilical fascia site using a 0 Vicryl stitch. I closed skin using 4-0 vicryl stitch.  Sterile dressings were applied.  Patient was extubated and sent to the recovery room.  I discussed the operative findings  with the patient's family. I suspect the patient is going used in the hospital at least overnight and will need antibiotics for 0 days. Questions answered. They expressed understanding and appreciation.

## 2014-11-16 NOTE — Transfer of Care (Signed)
Immediate Anesthesia Transfer of Care Note  Patient: Desiree Burke  Procedure(s) Performed: Procedure(s): APPENDECTOMY LAPAROSCOPIC (N/A)  Patient Location: PACU  Anesthesia Type:General  Level of Consciousness: awake, alert , oriented and patient cooperative  Airway & Oxygen Therapy: Patient Spontanous Breathing and Patient connected to face mask oxygen  Post-op Assessment: Report given to RN, Post -op Vital signs reviewed and stable and Patient moving all extremities X 4  Post vital signs: stable  Last Vitals:  Filed Vitals:   11/15/14 2142  BP: 120/61  Pulse: 96  Temp: 37.1 C  Resp: 18    Complications: No apparent anesthesia complications

## 2014-11-16 NOTE — Progress Notes (Signed)
1 Day Post-Op  Subjective: She is pretty sore, but otherwise looks fine.  Sites are OK Objective: Vital signs in last 24 hours: Temp:  [98.2 F (36.8 C)-98.7 F (37.1 C)] 98.5 F (36.9 C) (10/13 0515) Pulse Rate:  [60-97] 80 (10/13 0515) Resp:  [15-20] 16 (10/13 0515) BP: (100-121)/(49-65) 107/58 mmHg (10/13 0515) SpO2:  [97 %-100 %] 99 % (10/13 0515) Weight:  [63.589 kg (140 lb 3 oz)] 63.589 kg (140 lb 3 oz) (10/12 1559) Last BM Date: 11/15/14 Afebrile, VSS No labs Intake/Output from previous day: 10/12 0701 - 10/13 0700 In: 1520 [P.O.:120; I.V.:1400] Out: 700 [Urine:700] Intake/Output this shift:    General appearance: alert, cooperative and no distress GI: soft, sore, sites all look fine.  Lab Results:   Recent Labs  11/15/14 1625  WBC 13.5*  HGB 13.7  HCT 40.4  PLT 232    BMET  Recent Labs  11/15/14 1625  NA 138  K 4.2  CL 105  CO2 25  GLUCOSE 84  BUN 7  CREATININE 0.60  CALCIUM 9.3   PT/INR No results for input(s): LABPROT, INR in the last 72 hours.   Recent Labs Lab 11/15/14 1625  AST 22  ALT 13*  ALKPHOS 58  BILITOT 1.0  PROT 7.5  ALBUMIN 4.5     Lipase     Component Value Date/Time   LIPASE 20* 11/15/2014 1625     Studies/Results: Ct Abdomen Pelvis W Contrast  11/15/2014  CLINICAL DATA:  24 year old female with lower abdominal pain since yesterday, maximal in the right lower quadrant. Nausea. Initial encounter. EXAM: CT ABDOMEN AND PELVIS WITH CONTRAST TECHNIQUE: Multidetector CT imaging of the abdomen and pelvis was performed using the standard protocol following bolus administration of intravenous contrast. CONTRAST:  25mL OMNIPAQUE IOHEXOL 300 MG/ML SOLN, 100mL OMNIPAQUE IOHEXOL 300 MG/ML SOLN COMPARISON:  None. FINDINGS: Negative lung bases.  No pericardial or pleural effusion. Lower lumbar disc bulging. No acute osseous abnormality identified. Moderate volume of pelvic free fluid. Negative uterus and adnexa. Negative rectum, gas  distended. Diminutive urinary bladder. Oral contrast has reached the sigmoid colon which is redundant. Intermittent retained stool throughout the colon. In the right lower quadrant the appendix is abnormal. It is dilated and inflamed, measuring up to 18 mm in thickness. Hyper enhancement throughout the appendix. Small volume surrounding free fluid. No extraluminal gas identified. Some low-density stool suspected in the appendix. The terminal ileum is unaffected. No dilated small bowel. Negative stomach and duodenum. No upper abdominal free fluid or free air. Liver, gallbladder, spleen, pancreas and adrenal glands are within normal limits. Portal venous system is patent. Major arterial structures are patent. Both kidneys are normal. No lymphadenopathy. IMPRESSION: Acute appendicitis with dilated and inflamed appendix. Small to moderate volume of free fluid in the right lower quadrant and pelvis, favor reactive without strong evidence of perforation at this time. Electronically Signed   By: Odessa FlemingH  Hall M.D.   On: 11/15/2014 20:52    Medications: . norgestimate-ethinyl estradiol  1 tablet Oral Daily  . senna-docusate  1 tablet Oral QHS    Assessment/Plan Acute appendicitis Antibiotics:  Pre op only DVT:  SCD/hope to send home later today.    Plan:  Advance diet, mobilize, if she does well she can go home later today.  [place] from [place] so I will keep her till we can talk with them.      Keani Gotcher 11/16/2014

## 2014-11-16 NOTE — Discharge Summary (Signed)
Physician Discharge Summary  Patient ID: Desiree Burke MRN: [id number] DOB/AGE: 23-11-1990 23 y.o.  Admit date: 11/15/2014 Discharge date: 11/16/2014  Admission Diagnoses:  Acute appendicitis  Discharge Diagnoses:  Same Active Problems:   Acute appendicitis   PROCEDURES: Laparoscopic appendectomy,  11/16/14, Dr. Cristi LoronAlicia Thomas   Hospital Course: Complains of lower abdominal pain that started yesterday morning and has since migrated to right lower quadrant. She denies other symptoms except mild nausea. She has never had this pain before. Denies dysuria or hematuria. She has never had any abdominal surgeries before. She was seen and admitted by Dr. Maisie Fushomas.  She took her to the OR early this AM.  She has advanced her diet, is walking in the halls, port sites all look good.  She now has her mother here with her.  We discussed discharge plans and instructions.  She will follow up in the Clinic in about 3 weeks.  Condition on Discharge:  Improving              Disposition: Final discharge disposition not confirmed     Medication List    TAKE these medications        albuterol 108 (90 BASE) MCG/ACT inhaler  Commonly known as:  PROVENTIL HFA;VENTOLIN HFA  Inhale 2 puffs into the lungs every 4 (four) hours as needed for wheezing or shortness of breath.     fexofenadine 180 MG tablet  Commonly known as:  ALLEGRA  Take 180 mg by mouth daily as needed for allergies or rhinitis.     HYDROcodone-acetaminophen 5-325 MG tablet  Commonly known as:  NORCO/VICODIN  Take 1-2 tablets by mouth every 4 (four) hours as needed for moderate pain.     ibuprofen 200 MG tablet  Commonly known as:  ADVIL,MOTRIN  You can take 2-3 tablets every 6 hours as needed for pain safely     mometasone 50 MCG/ACT nasal spray  Commonly known as:  NASONEX  Place 2 sprays into the nose daily as needed (allergies).     MONO-LINYAH 0.25-35 MG-MCG tablet  Generic drug:  norgestimate-ethinyl  estradiol  Take 1 tablet by mouth daily.     montelukast 10 MG tablet  Commonly known as:  SINGULAIR  Take 10 mg by mouth at bedtime as needed (allergy season).     mupirocin ointment 2 %  Commonly known as:  BACTROBAN  Place 1 application into the nose 2 (two) times daily as needed (as needed for infection on nose.).           Follow-up Information    Follow up with [facility] On 11/29/2014.   Specialty:  General Surgery   Why:  Call to confirm appointment tomorrow, or Monday.  Your appointment should be for 11:45 AM.  Arrive 30 minutes early for check in and paper work.   Contact information:   [address] [place] [id number] [phone]       Signed: Sherrie GeorgeJENNINGS,Marcanthony Sleight 11/16/2014, 2:30 PM

## 2014-11-16 NOTE — Anesthesia Postprocedure Evaluation (Signed)
  Anesthesia Post-op Note  Patient: Desiree Burke  Procedure(s) Performed: Procedure(s) (LRB): APPENDECTOMY LAPAROSCOPIC (N/A)  Patient Location: PACU  Anesthesia Type: General  Level of Consciousness: awake and alert   Airway and Oxygen Therapy: Patient Spontanous Breathing  Post-op Pain: mild  Post-op Assessment: Post-op Vital signs reviewed, Patient's Cardiovascular Status Stable, Respiratory Function Stable, Patent Airway and No signs of Nausea or vomiting  Last Vitals:  Filed Vitals:   11/16/14 0130  BP: 101/49  Pulse: 60  Temp: 37 C  Resp: 18    Post-op Vital Signs: stable   Complications: No apparent anesthesia complications

## 2016-07-30 IMAGING — CT CT ABD-PELV W/ CM
2 of 4 series · 15 of 46 positions shown, 17 images · IV contrast (OMNIPAQUE 300)
Comparison: None.

CLINICAL DATA: 23-year-old female with lower abdominal pain since
yesterday, maximal in the right lower quadrant. Nausea. Initial
encounter.

EXAM:
CT ABDOMEN AND PELVIS WITH CONTRAST
TECHNIQUE: Multidetector CT imaging of the abdomen and pelvis was performed
using the standard protocol following bolus administration of
intravenous contrast.
CONTRAST:  25mL OMNIPAQUE IOHEXOL 300 MG/ML SOLN, 100mL OMNIPAQUE
IOHEXOL 300 MG/ML SOLN

[Series 2: abd/pel with · axial · 0.64mm/px · z∈[+775,+1160]mm · 12 of 93 slices shown, 14 images]
[im 8/93  soft-tissue]
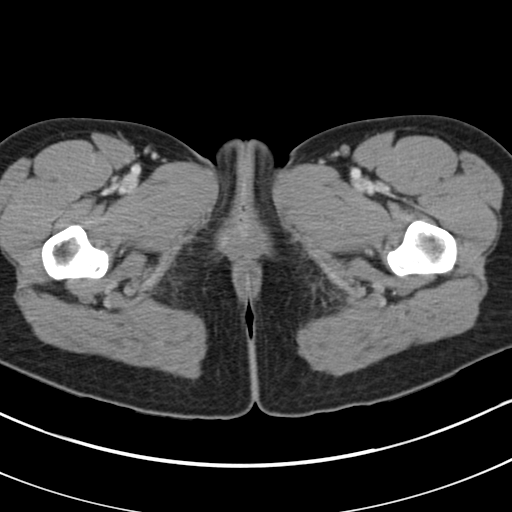
[im 8/93  bone]
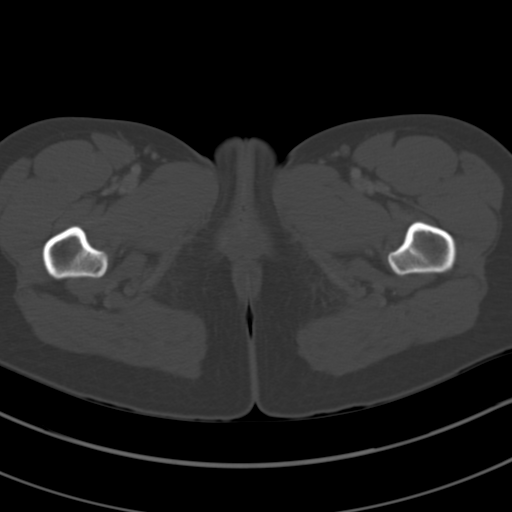
[im 15/93  soft-tissue]
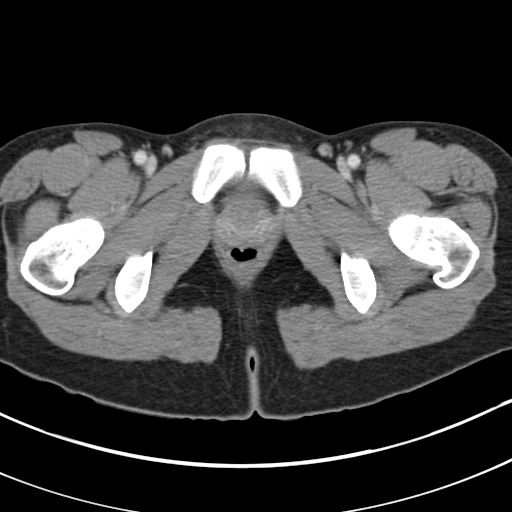
[im 22/93  soft-tissue]
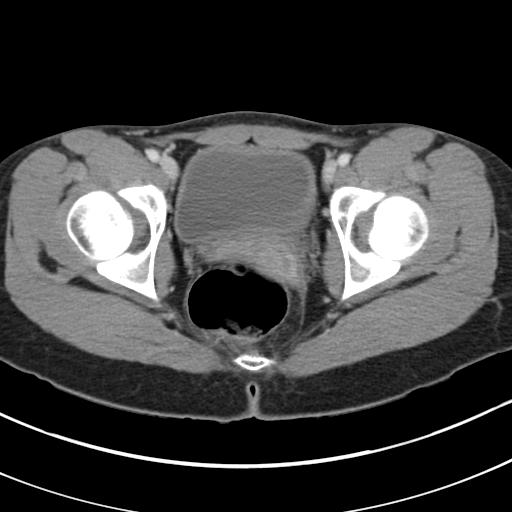
[im 29/93  soft-tissue]
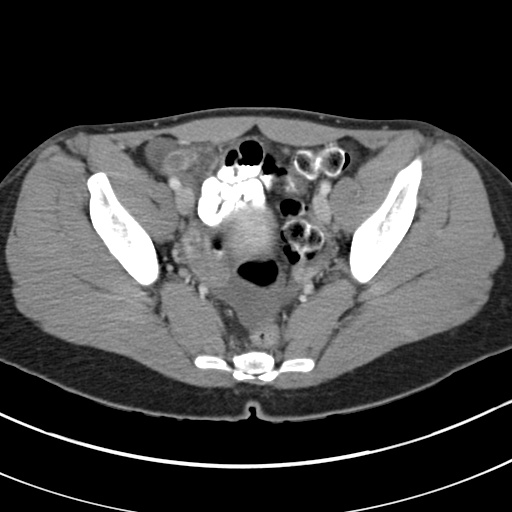
[im 36/93  soft-tissue]
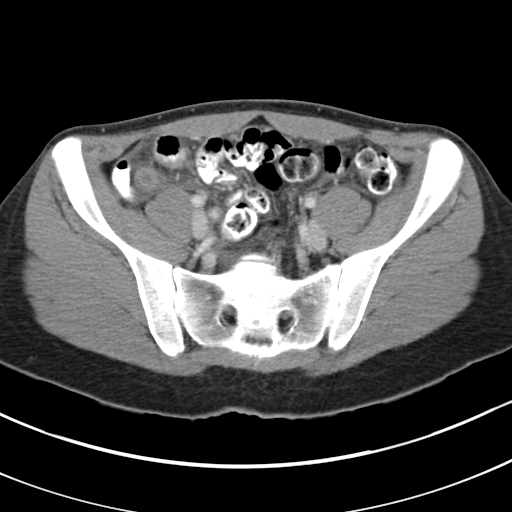
[im 43/93  soft-tissue]
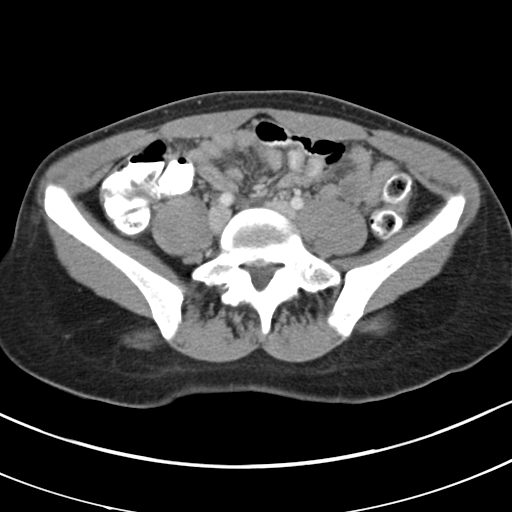
[im 50/93  soft-tissue]
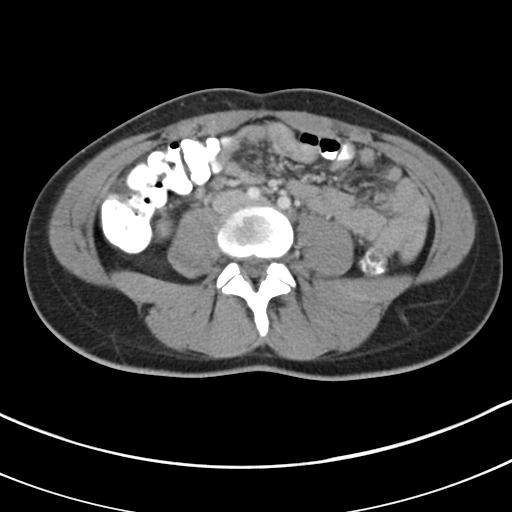
[im 57/93  soft-tissue]
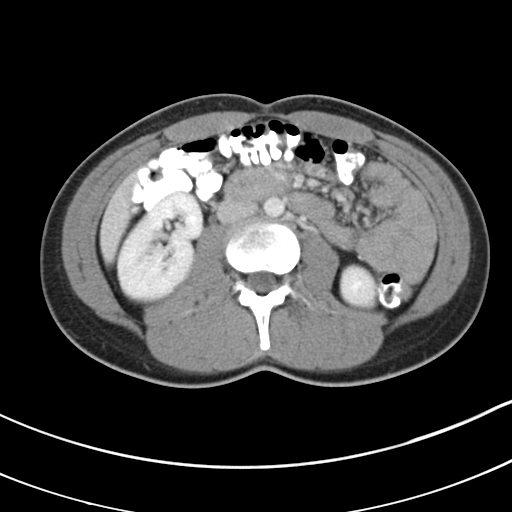
[im 64/93  soft-tissue]
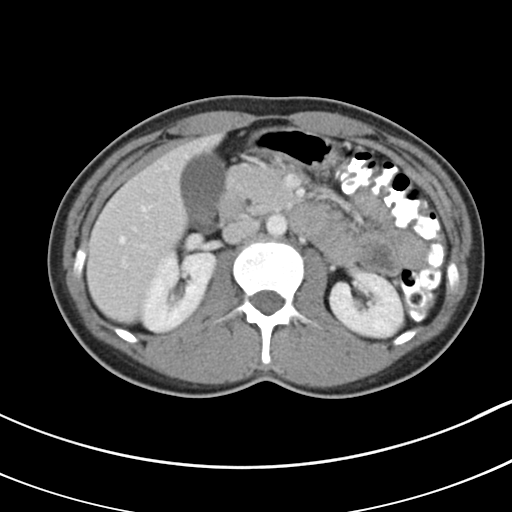
[im 64/93  bone]
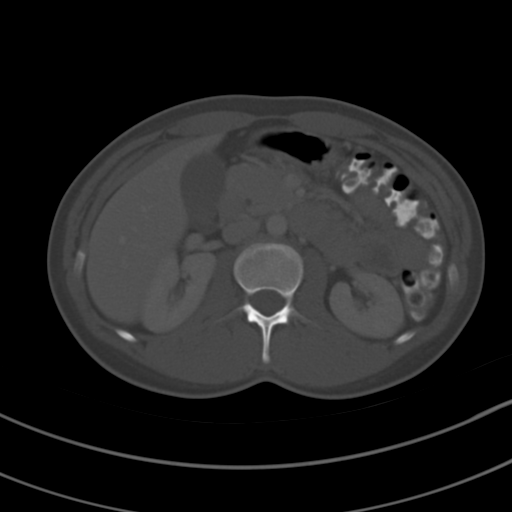
[im 71/93  soft-tissue]
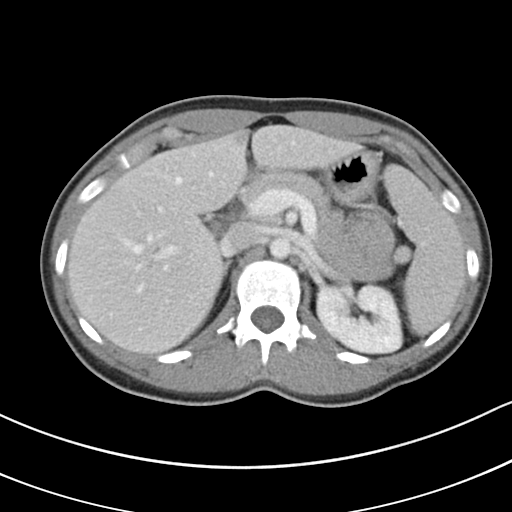
[im 78/93  soft-tissue]
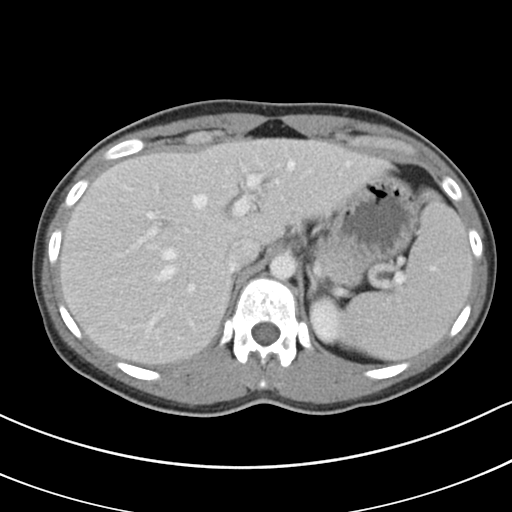
[im 85/93  soft-tissue]
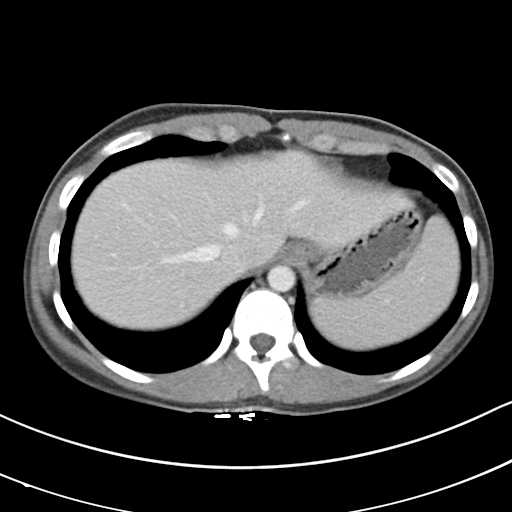

[Series 5: coronal a/|p · coronal · 0.64mm/px · 3 of 68 slices shown]
[im 23/68  soft-tissue]
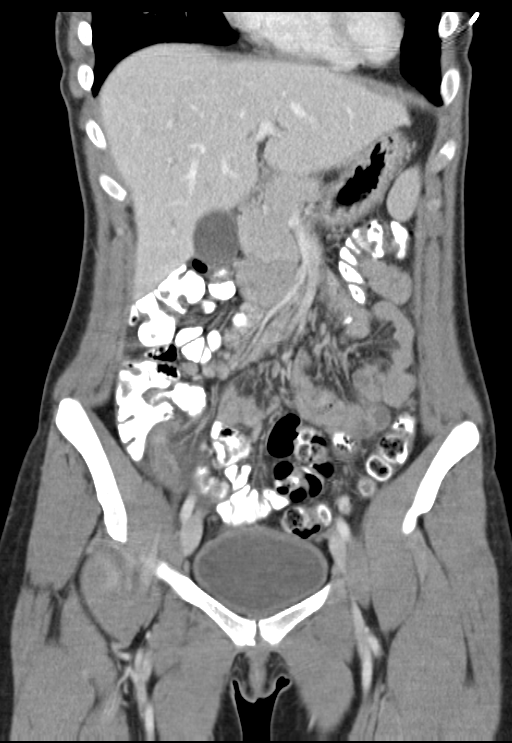
[im 30/68  soft-tissue]
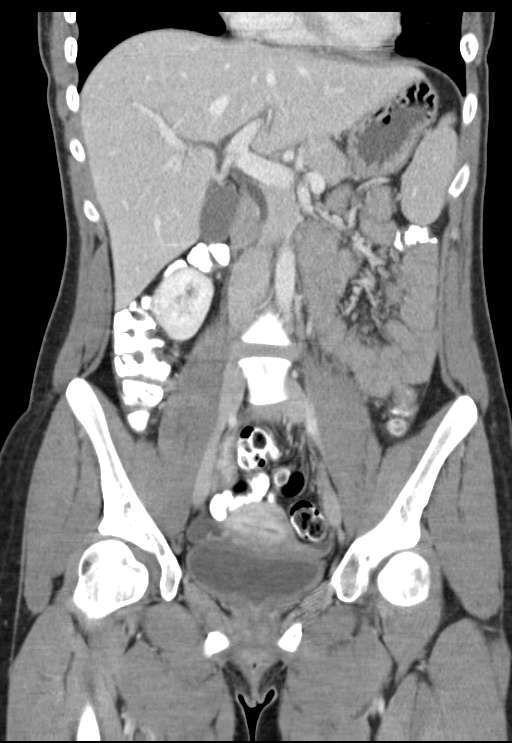
[im 38/68  soft-tissue]
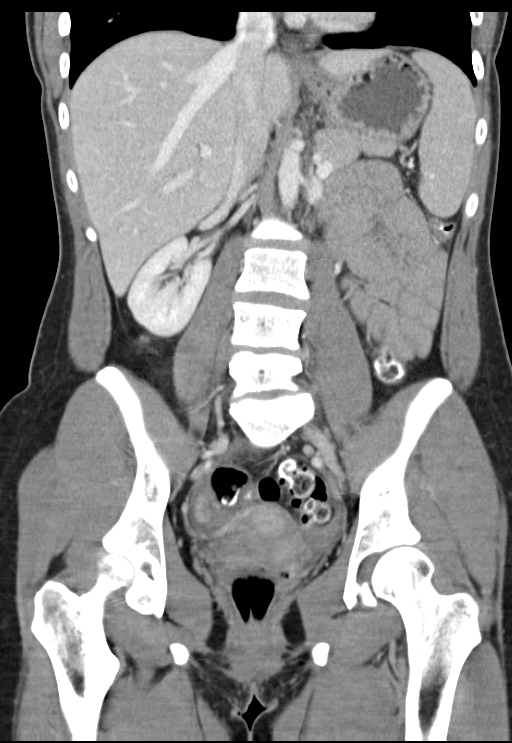

[15 of 46 positions shown; findings below may reference images not displayed]

FINDINGS: Negative lung bases.  No pericardial or pleural effusion.

Lower lumbar disc bulging. No acute osseous abnormality identified.

Moderate volume of pelvic free fluid. Negative uterus and adnexa.
Negative rectum, gas distended. Diminutive urinary bladder.

Oral contrast has reached the sigmoid colon which is redundant.
Intermittent retained stool throughout the colon. In the right lower
quadrant the appendix is abnormal. It is dilated and inflamed,
measuring up to 18 mm in thickness. Hyper enhancement throughout the
appendix. Small volume surrounding free fluid. No extraluminal gas
identified. Some low-density stool suspected in the appendix.

The terminal ileum is unaffected. No dilated small bowel. Negative
stomach and duodenum.

No upper abdominal free fluid or free air. Liver, gallbladder,
spleen, pancreas and adrenal glands are within normal limits. Portal
venous system is patent. Major arterial structures are patent. Both
kidneys are normal. No lymphadenopathy.
IMPRESSION: Acute appendicitis with dilated and inflamed appendix. Small to
moderate volume of free fluid in the right lower quadrant and
pelvis, favor reactive without strong evidence of perforation at
this time.
